# Patient Record
Sex: Male | Born: 1980 | Race: White | Hispanic: No | Marital: Married
Health system: Southern US, Community
[De-identification: ages and names within clinical notes are randomized; demographics above are authoritative.]

---

## 2020-07-24 ENCOUNTER — Emergency Department (HOSPITAL_COMMUNITY): Payer: Worker's Compensation

## 2020-07-24 ENCOUNTER — Ambulatory Visit (HOSPITAL_COMMUNITY)
Admission: EM | Admit: 2020-07-24 | Discharge: 2020-07-24 | Disposition: A | Discharge status: Home / Self Care | Payer: Worker's Compensation | Attending: Emergency Medicine | Admitting: Emergency Medicine

## 2020-07-24 ENCOUNTER — Other Ambulatory Visit: Payer: Self-pay

## 2020-07-24 ENCOUNTER — Encounter (HOSPITAL_COMMUNITY): Payer: Self-pay | Admitting: *Deleted

## 2020-07-24 ENCOUNTER — Encounter (HOSPITAL_COMMUNITY)
Admission: EM | Disposition: A | Discharge status: Home / Self Care | Payer: Self-pay | Source: Home / Self Care | Attending: Emergency Medicine

## 2020-07-24 ENCOUNTER — Emergency Department (HOSPITAL_COMMUNITY): Payer: Worker's Compensation | Admitting: Certified Registered Nurse Anesthetist

## 2020-07-24 DIAGNOSIS — S66195A Other injury of flexor muscle, fascia and tendon of left ring finger at wrist and hand level, initial encounter: Secondary | ICD-10-CM | POA: Diagnosis not present

## 2020-07-24 DIAGNOSIS — S62617B Displaced fracture of proximal phalanx of left little finger, initial encounter for open fracture: Secondary | ICD-10-CM

## 2020-07-24 DIAGNOSIS — S62615B Displaced fracture of proximal phalanx of left ring finger, initial encounter for open fracture: Secondary | ICD-10-CM | POA: Diagnosis not present

## 2020-07-24 DIAGNOSIS — Z20822 Contact with and (suspected) exposure to covid-19: Secondary | ICD-10-CM | POA: Insufficient documentation

## 2020-07-24 DIAGNOSIS — S56126A Laceration of flexor muscle, fascia and tendon of left ring finger at forearm level, initial encounter: Secondary | ICD-10-CM | POA: Insufficient documentation

## 2020-07-24 DIAGNOSIS — W230XXA Caught, crushed, jammed, or pinched between moving objects, initial encounter: Secondary | ICD-10-CM | POA: Insufficient documentation

## 2020-07-24 DIAGNOSIS — E669 Obesity, unspecified: Secondary | ICD-10-CM | POA: Insufficient documentation

## 2020-07-24 DIAGNOSIS — Z6838 Body mass index (BMI) 38.0-38.9, adult: Secondary | ICD-10-CM | POA: Diagnosis not present

## 2020-07-24 DIAGNOSIS — S61412A Laceration without foreign body of left hand, initial encounter: Secondary | ICD-10-CM

## 2020-07-24 HISTORY — PX: I & D EXTREMITY: SHX5045

## 2020-07-24 HISTORY — PX: CLOSED REDUCTION FINGER WITH PERCUTANEOUS PINNING: SHX5612

## 2020-07-24 LAB — SARS CORONAVIRUS 2 BY RT PCR (HOSPITAL ORDER, PERFORMED IN ~~LOC~~ HOSPITAL LAB): SARS Coronavirus 2: NEGATIVE

## 2020-07-24 SURGERY — CLOSED REDUCTION, FINGER, WITH PERCUTANEOUS PINNING
Anesthesia: General | Site: Finger | Laterality: Left

## 2020-07-24 MED ORDER — ACETAMINOPHEN 10 MG/ML IV SOLN
INTRAVENOUS | Status: DC | PRN
Start: 2020-07-24 — End: 2020-07-24
  Administered 2020-07-24: 1000 mg via INTRAVENOUS

## 2020-07-24 MED ORDER — PROMETHAZINE HCL 25 MG/ML IJ SOLN: 6.2500 mg | INTRAMUSCULAR | Status: DC | PRN

## 2020-07-24 MED ORDER — DEXAMETHASONE SODIUM PHOSPHATE 10 MG/ML IJ SOLN
INTRAMUSCULAR | Status: AC
  Filled 2020-07-24: qty 1

## 2020-07-24 MED ORDER — CEFAZOLIN SODIUM-DEXTROSE 1-4 GM/50ML-% IV SOLN
1.0000 g | Freq: Once | INTRAVENOUS | Status: AC
  Administered 2020-07-24: 1 g via INTRAVENOUS
  Filled 2020-07-24: qty 50

## 2020-07-24 MED ORDER — PROPOFOL 10 MG/ML IV BOLUS
INTRAVENOUS | Status: DC | PRN
  Administered 2020-07-24: 200 mg via INTRAVENOUS

## 2020-07-24 MED ORDER — POVIDONE-IODINE 10 % EX SWAB
2.0000 "application " | Freq: Once | CUTANEOUS | Status: AC
  Administered 2020-07-24: 2 via TOPICAL

## 2020-07-24 MED ORDER — OXYCODONE-ACETAMINOPHEN 5-325 MG PO TABS
ORAL_TABLET | ORAL | Status: AC
  Filled 2020-07-24: qty 2

## 2020-07-24 MED ORDER — MIDAZOLAM HCL 5 MG/5ML IJ SOLN
INTRAMUSCULAR | Status: DC | PRN
  Administered 2020-07-24: 2 mg via INTRAVENOUS

## 2020-07-24 MED ORDER — ORAL CARE MOUTH RINSE: 15.0000 mL | Freq: Once | OROMUCOSAL | Status: AC

## 2020-07-24 MED ORDER — PHENYLEPHRINE 40 MCG/ML (10ML) SYRINGE FOR IV PUSH (FOR BLOOD PRESSURE SUPPORT)
PREFILLED_SYRINGE | INTRAVENOUS | Status: DC | PRN
  Administered 2020-07-24: 80 ug via INTRAVENOUS

## 2020-07-24 MED ORDER — BUPIVACAINE HCL (PF) 0.25 % IJ SOLN
INTRAMUSCULAR | Status: DC | PRN
  Administered 2020-07-24: 8 mL

## 2020-07-24 MED ORDER — MIDAZOLAM HCL 2 MG/2ML IJ SOLN
INTRAMUSCULAR | Status: AC
  Filled 2020-07-24: qty 2

## 2020-07-24 MED ORDER — PHENYLEPHRINE 40 MCG/ML (10ML) SYRINGE FOR IV PUSH (FOR BLOOD PRESSURE SUPPORT)
PREFILLED_SYRINGE | INTRAVENOUS | Status: AC
  Filled 2020-07-24: qty 10

## 2020-07-24 MED ORDER — LACTATED RINGERS IV SOLN: INTRAVENOUS | Status: DC

## 2020-07-24 MED ORDER — FENTANYL CITRATE (PF) 250 MCG/5ML IJ SOLN
INTRAMUSCULAR | Status: AC
  Filled 2020-07-24: qty 5

## 2020-07-24 MED ORDER — CEPHALEXIN 500 MG PO CAPS: 500.0000 mg | ORAL_CAPSULE | Freq: Four times a day (QID) | ORAL | 0 refills | Status: AC

## 2020-07-24 MED ORDER — CEFAZOLIN SODIUM-DEXTROSE 2-4 GM/100ML-% IV SOLN
2.0000 g | INTRAVENOUS | Status: AC
  Administered 2020-07-24: 2 g via INTRAVENOUS

## 2020-07-24 MED ORDER — FENTANYL CITRATE (PF) 100 MCG/2ML IJ SOLN: 25.0000 ug | INTRAMUSCULAR | Status: DC | PRN

## 2020-07-24 MED ORDER — LIDOCAINE 2% (20 MG/ML) 5 ML SYRINGE
INTRAMUSCULAR | Status: DC | PRN
  Administered 2020-07-24: 80 mg via INTRAVENOUS

## 2020-07-24 MED ORDER — LIDOCAINE 2% (20 MG/ML) 5 ML SYRINGE
INTRAMUSCULAR | Status: AC
  Filled 2020-07-24: qty 5

## 2020-07-24 MED ORDER — FENTANYL CITRATE (PF) 100 MCG/2ML IJ SOLN: 50.0000 ug | Freq: Once | INTRAMUSCULAR | Status: AC

## 2020-07-24 MED ORDER — ACETAMINOPHEN 10 MG/ML IV SOLN
INTRAVENOUS | Status: AC
  Filled 2020-07-24: qty 100

## 2020-07-24 MED ORDER — PROPOFOL 10 MG/ML IV BOLUS
INTRAVENOUS | Status: AC
  Filled 2020-07-24: qty 20

## 2020-07-24 MED ORDER — ARTIFICIAL TEARS OPHTHALMIC OINT
TOPICAL_OINTMENT | OPHTHALMIC | Status: AC
  Filled 2020-07-24: qty 3.5

## 2020-07-24 MED ORDER — ONDANSETRON HCL 4 MG/2ML IJ SOLN
INTRAMUSCULAR | Status: AC
  Filled 2020-07-24: qty 2

## 2020-07-24 MED ORDER — CHLORHEXIDINE GLUCONATE 0.12 % MT SOLN
15.0000 mL | Freq: Once | OROMUCOSAL | Status: AC
  Administered 2020-07-24: 15 mL via OROMUCOSAL

## 2020-07-24 MED ORDER — OXYCODONE-ACETAMINOPHEN 5-325 MG PO TABS
2.0000 | ORAL_TABLET | Freq: Once | ORAL | Status: AC
  Administered 2020-07-24: 2 via ORAL

## 2020-07-24 MED ORDER — FENTANYL CITRATE (PF) 100 MCG/2ML IJ SOLN
100.0000 ug | Freq: Once | INTRAMUSCULAR | Status: AC
  Administered 2020-07-24: 100 ug via INTRAVENOUS
  Filled 2020-07-24: qty 2

## 2020-07-24 MED ORDER — SODIUM CHLORIDE 0.9 % IR SOLN
Status: DC | PRN
  Administered 2020-07-24: 1000 mL

## 2020-07-24 MED ORDER — FENTANYL CITRATE (PF) 250 MCG/5ML IJ SOLN
INTRAMUSCULAR | Status: DC | PRN
  Administered 2020-07-24 (×3): 50 ug via INTRAVENOUS

## 2020-07-24 MED ORDER — CHLORHEXIDINE GLUCONATE 4 % EX LIQD
60.0000 mL | Freq: Once | CUTANEOUS | Status: DC
  Filled 2020-07-24: qty 60

## 2020-07-24 MED ORDER — FENTANYL CITRATE (PF) 100 MCG/2ML IJ SOLN
INTRAMUSCULAR | Status: AC
  Administered 2020-07-24: 50 ug via INTRAVENOUS
  Filled 2020-07-24: qty 2

## 2020-07-24 MED ORDER — ONDANSETRON HCL 4 MG/2ML IJ SOLN
INTRAMUSCULAR | Status: DC | PRN
  Administered 2020-07-24: 4 mg via INTRAVENOUS

## 2020-07-24 MED ORDER — OXYCODONE-ACETAMINOPHEN 10-325 MG PO TABS: 1.0000 | ORAL_TABLET | Freq: Four times a day (QID) | ORAL | 0 refills | Status: AC | PRN

## 2020-07-24 MED ORDER — DEXAMETHASONE SODIUM PHOSPHATE 10 MG/ML IJ SOLN
INTRAMUSCULAR | Status: DC | PRN
Start: 2020-07-24 — End: 2020-07-24
  Administered 2020-07-24: 8 mg via INTRAVENOUS

## 2020-07-24 MED ORDER — BUPIVACAINE HCL (PF) 0.25 % IJ SOLN
INTRAMUSCULAR | Status: AC
  Filled 2020-07-24: qty 30

## 2020-07-24 SURGICAL SUPPLY — 31 items
BNDG ESMARK 4X9 LF (GAUZE/BANDAGES/DRESSINGS) ×4 IMPLANT
BNDG GAUZE ELAST 4 BULKY (GAUZE/BANDAGES/DRESSINGS) ×4 IMPLANT
CORD BIPOLAR FORCEPS 12FT (ELECTRODE) ×4 IMPLANT
CUFF TOURN SGL QUICK 18X4 (TOURNIQUET CUFF) ×4 IMPLANT
DRAPE OEC MINIVIEW 54X84 (DRAPES) ×4 IMPLANT
DRAPE SURG 17X23 STRL (DRAPES) ×4 IMPLANT
DRSG EMULSION OIL 3X3 NADH (GAUZE/BANDAGES/DRESSINGS) ×4 IMPLANT
DRSG XEROFORM 1X8 (GAUZE/BANDAGES/DRESSINGS) ×4 IMPLANT
GAUZE SPONGE 4X4 12PLY STRL (GAUZE/BANDAGES/DRESSINGS) ×4 IMPLANT
GAUZE XEROFORM 1X8 LF (GAUZE/BANDAGES/DRESSINGS) ×4 IMPLANT
GLOVE BIOGEL PI IND STRL 8.5 (GLOVE) ×2 IMPLANT
GLOVE BIOGEL PI INDICATOR 8.5 (GLOVE) ×2
GLOVE SURG ORTHO 8.0 STRL STRW (GLOVE) ×4 IMPLANT
GOWN STRL REUS W/ TWL LRG LVL3 (GOWN DISPOSABLE) ×6 IMPLANT
GOWN STRL REUS W/ TWL XL LVL3 (GOWN DISPOSABLE) ×2 IMPLANT
GOWN STRL REUS W/TWL LRG LVL3 (GOWN DISPOSABLE) ×6
GOWN STRL REUS W/TWL XL LVL3 (GOWN DISPOSABLE) ×2
KIT BASIN OR (CUSTOM PROCEDURE TRAY) ×4 IMPLANT
KIT TURNOVER KIT B (KITS) ×4 IMPLANT
NS IRRIG 1000ML POUR BTL (IV SOLUTION) ×4 IMPLANT
PACK ORTHO EXTREMITY (CUSTOM PROCEDURE TRAY) ×4 IMPLANT
SPLINT FIBERGLASS 3X12 (CAST SUPPLIES) ×4 IMPLANT
SUCTION FRAZIER HANDLE 10FR (MISCELLANEOUS) ×2
SUCTION TUBE FRAZIER 10FR DISP (MISCELLANEOUS) ×2 IMPLANT
SUT ETHILON 4 0 PS 2 18 (SUTURE) ×12 IMPLANT
SUT MERSILENE 4 0 P 3 (SUTURE) ×4 IMPLANT
SUT PROLENE 4 0 PS 2 18 (SUTURE) ×12 IMPLANT
TOWEL GREEN STERILE FF (TOWEL DISPOSABLE) ×4 IMPLANT
TUBE CONNECTING 12'X1/4 (SUCTIONS) ×1
TUBE CONNECTING 12X1/4 (SUCTIONS) ×3 IMPLANT
UNDERPAD 30X36 HEAVY ABSORB (UNDERPADS AND DIAPERS) ×4 IMPLANT

## 2020-07-24 NOTE — Op Note (Signed)
PREOPERATIVE DIAGNOSIS: LEFT RING FINGER OPEN PROXIMAL PHALANX FRACTURE  POSTOPERATIVE DIAGNOSIS:SAME  ATTENDING SURGEON:DR. Jalacia Mattila WHO WAS SCRUBBED AND PRESENT FOR THE ENTIRE PROCEDURE  ASSISTANT SURGEON: NONE  ANESTHESIA:GENERAL VIA LMA  OPERATIVE PROCEDURE: #1: Open debridement of skin subcutaneous tissue and bone associated with the left index finger proximal phalanx fracture #2: Open treatment of left index finger proximal phalanx fracture requiring internal fixation #3: Repair left ring finger A2 pulley with local tissue #4: Left ring finger ulnar digital nerve neurolysis and decompression exploration #5: Traumatic laceration repair 6 cm left ring finger #6: Radiographs 3 views left ring finger  IMPLANTS: Two 0.045 K wires.  RADIOGRAPHIC INTERPRETATION: AP lateral and oblique views of the finger do show the cross K wire fixation in place with a comminuted proximal phalanx fracture  SURGICAL INDICATIONS: Patient is a left-hand dominant gentleman who sustained a crushing injury to his left ring finger while at work.  Patient was seen evaluate and recommended undergo the above procedure.  Risks of surgery include but not limited to bleeding infection damage nearby nerves arteries or tendons nonunion malunion hardware failure loss of motion of the wrist and digits incomplete relief of symptoms and need for further surgical intervention.  SURGICAL TECHNIQUE: Patient was palpated find the preoperative holding area marked the permanent marker made the left ring finger to indicate correct operative site.  Patient brought back operating placed supine on anesthesia table where the general anesthetic was administered.  Preoperative antibiotics were given prior to skin incision.  Left upper extremities then prepped and draped normal sterile fashion after tourniquet been placed on the left brachium and sealed with the appropriate drape.  Timeout was called the correct site was identified the  procedure then begun.  Attention was then turned to the ring finger of the sick centimeter laceration was extended proximally and distally volarly.  Wound exploration and excisional debridement of the devitalized tissue of the skin subcutaneous tissue and bone several devitalized bone fragments were then removed.  This was an open debridement.  Thorough wound irrigation done.  Following this open reduction was then performed.  Two 0.045 K wires were then placed one from a retrograde approach and the other 1 from an antegrade approach.  These were well visualized crossing the fracture site with good purchase in the opposite engaging cortices.  The K wires were then cut and bent left out dorsally.  After open reduction attention was then turned to nerve exploration.  The ulnar digital artery and nerve were both carefully explored and decompressed.  Nerve decompression and exploration was then done and these were both in continuity.  Following this the patient did have the significant injury to the flexor sheath.  There was continuity of both the FDS and FDP however disruption of the A2 pulley.  With 4-0 Mersilene suture the local tissue was in tact therefore was able to repair the A2 pulley primarily.  This covered the flexor sheath very nicely.  After repair of the pulley the laceration and extended incision was then closed with Prolene suture.  This complex closure of the open wound and laceration.  6 cc of quarter percent Marcaine infiltrated volarly.  Adaptic dressing sterile compressive bandage applied.  The tourniquet was deflated with good perfusion the fingertip.  The patient was then placed with Xeroform around the pin sites dorsally.  The patient was then placed in a well-padded volar splint extubated taken recovery room in good condition.  POSTOPERATIVE PLAN: Patient be discharged to home.  Seen back in the office in 8 days for wound check K wire check x-rays back into a splint down to therapy.  We will  see him back at the 2-week mark to take the sutures out.  He will be out of work for the first week.  He will be on pain medications and therefore I will be out of work.  At the 2-week mark we may consider light duty work.  Therapy order will be placed the first visit for the splint and then will begin working on gentle range of motion of the index long and small.  K wires likely in 4 to 6 weeks.  Radiographs at each visit.

## 2020-07-24 NOTE — ED Notes (Signed)
Last ate/drank yesterday at 2200.

## 2020-07-24 NOTE — Anesthesia Preprocedure Evaluation (Signed)
Anesthesia Evaluation  Patient identified by MRN, date of birth, ID band Patient awake    Reviewed: Allergy & Precautions, NPO status , Patient's Chart, lab work & pertinent test results  Airway Mallampati: II  TM Distance: >3 FB Neck ROM: Full    Dental  (+) Dental Advisory Given, Missing   Pulmonary neg pulmonary ROS,    Pulmonary exam normal breath sounds clear to auscultation       Cardiovascular negative cardio ROS Normal cardiovascular exam Rhythm:Regular Rate:Normal     Neuro/Psych negative neurological ROS     GI/Hepatic negative GI ROS, Neg liver ROS,   Endo/Other  Obesity   Renal/GU negative Renal ROS     Musculoskeletal negative musculoskeletal ROS (+)   Abdominal   Peds  Hematology negative hematology ROS (+)   Anesthesia Other Findings Day of surgery medications reviewed with the patient.  Reproductive/Obstetrics                             Anesthesia Physical Anesthesia Plan  ASA: II  Anesthesia Plan: General   Post-op Pain Management:    Induction: Intravenous  PONV Risk Score and Plan: 2 and Midazolam, Dexamethasone and Ondansetron  Airway Management Planned: LMA  Additional Equipment:   Intra-op Plan:   Post-operative Plan: Extubation in OR  Informed Consent: I have reviewed the patients History and Physical, chart, labs and discussed the procedure including the risks, benefits and alternatives for the proposed anesthesia with the patient or authorized representative who has indicated his/her understanding and acceptance.     Dental advisory given  Plan Discussed with: CRNA  Anesthesia Plan Comments:         Anesthesia Quick Evaluation

## 2020-07-24 NOTE — Transfer of Care (Signed)
Immediate Anesthesia Transfer of Care Note  Patient: Derek Mclean  Procedure(s) Performed: CLOSED REDUCTION FINGER WITH PERCUTANEOUS PINNING (Left Finger) IRRIGATION AND DEBRIDEMENT EXTREMITY (Left )  Patient Location: PACU  Anesthesia Type:General  Level of Consciousness: patient cooperative and responds to stimulation  Airway & Oxygen Therapy: Patient Spontanous Breathing and Patient connected to face mask oxygen  Post-op Assessment: Report given to RN and Post -op Vital signs reviewed and stable  Post vital signs: Reviewed and stable  Last Vitals:  Vitals Value Taken Time  BP 146/77 07/24/20 1809  Temp    Pulse 94 07/24/20 1810  Resp 14 07/24/20 1810  SpO2 100 % 07/24/20 1810  Vitals shown include unvalidated device data.  Last Pain:  Vitals:   07/24/20 1007  TempSrc:   PainSc: 7          Complications: No complications documented.

## 2020-07-24 NOTE — Anesthesia Postprocedure Evaluation (Signed)
Anesthesia Post Note  Patient: Perley Arthurs  Procedure(s) Performed: CLOSED REDUCTION FINGER WITH PERCUTANEOUS PINNING (Left Finger) IRRIGATION AND DEBRIDEMENT EXTREMITY (Left )     Patient location during evaluation: PACU Anesthesia Type: General Level of consciousness: awake and alert Pain management: pain level controlled Vital Signs Assessment: post-procedure vital signs reviewed and stable Respiratory status: spontaneous breathing, nonlabored ventilation, respiratory function stable and patient connected to nasal cannula oxygen Cardiovascular status: blood pressure returned to baseline and stable Postop Assessment: no apparent nausea or vomiting Anesthetic complications: no   No complications documented.  Last Vitals:  Vitals:   07/24/20 1840 07/24/20 1855  BP: 123/77 (!) 140/93  Pulse: 84 81  Resp: 14 17  Temp:  36.6 C  SpO2: 95% 95%    Last Pain:  Vitals:   07/24/20 1855  TempSrc:   PainSc: 0-No pain                 Erma Joubert COKER

## 2020-07-24 NOTE — ED Triage Notes (Signed)
Pt had a dock loader come down on his left hand.  Significant lac and deep to see bone- pt has controlled bleeding.  Concern for open fracture and patient states it was crushed. Pt last tetanus 4 years ago.  Pt can wiggle fingers but not feeling in fingers

## 2020-07-24 NOTE — ED Provider Notes (Addendum)
[facility] EMERGENCY DEPARTMENT Provider Note   CSN: [id number] Arrival date & time: 07/24/20  1025     History Chief Complaint  Patient presents with  . Laceration  . Hand Injury    Derek Mclean is a 39 y.o. male.  Presents to ER with hand laceration.  Patient had Dr. Gavin Pound come down on the left hand, causing crush injury significant laceration.  Pain currently 7 out of 10 in severity sharp, stabbing.  Bleeding controlled with direct pressure.  Tetanus 2 years ago.  Left-hand-dominant.  Has noted tingling sensation in his finger but no skin discoloration and distal finger.  Denies any medical problems.  N.p.o. since last night.  Denies any other injury.  HPI     History reviewed. No pertinent past medical history.  There are no problems to display for this patient.   History reviewed. No pertinent surgical history.     No family history on file.  Social History   Tobacco Use  . Smoking status: Not on file  Substance Use Topics  . Alcohol use: Not on file  . Drug use: Not on file    Home Medications Prior to Admission medications   Not on File    Allergies    Patient has no allergy information on record.  Review of Systems   Review of Systems  Constitutional: Negative for chills and fever.  HENT: Negative for ear pain and sore throat.   Eyes: Negative for pain and visual disturbance.  Respiratory: Negative for cough and shortness of breath.   Cardiovascular: Negative for chest pain and palpitations.  Gastrointestinal: Negative for abdominal pain and vomiting.  Genitourinary: Negative for dysuria and hematuria.  Musculoskeletal: Positive for arthralgias. Negative for back pain.  Skin: Negative for color change and rash.  Neurological: Negative for seizures and syncope.  All other systems reviewed and are negative.   Physical Exam Updated Vital Signs BP 113/83 (BP Location: Right Arm)   Pulse 65   Temp 97.7 F (36.5 C) (Oral)    Resp 18   Ht 5\' 5"  (1.651 m)   Wt (!) 106.1 kg   SpO2 97%   BMI 38.94 kg/m   Physical Exam Vitals and nursing note reviewed.  Constitutional:      Appearance: He is well-developed.  HENT:     Head: Normocephalic and atraumatic.  Eyes:     Conjunctiva/sclera: Conjunctivae normal.  Cardiovascular:     Rate and Rhythm: Normal rate.     Pulses: Normal pulses.  Pulmonary:     Effort: Pulmonary effort is normal. No respiratory distress.  Musculoskeletal:     Cervical back: Neck supple.     Comments: L hand: laceration over palmar aspect at base of 4th finger, exposed flexor tendon, sensation to light touch of lateral aspect of finger decreased, unable to flex finger, can extend; good distal cap refill  Skin:    General: Skin is warm and dry.  Neurological:     Mental Status: He is alert.     Comments: Sensory deficit as above  Psychiatric:        Mood and Affect: Mood normal.        Behavior: Behavior normal.    Media Information   Document Information  Photos  Left hand  07/24/2020 10:05  Attached To:  Hospital Encounter on 07/24/20  Source Information  Quay Simkin, 07/26/20, MD  [facility]     ED Results / Procedures / Treatments   Labs (all  labs ordered are listed, but only abnormal results are displayed) Labs Reviewed  SARS CORONAVIRUS 2 BY RT PCR [facility] ORDER, PERFORMED IN [facility] LAB)    EKG None  Radiology DG Hand Complete Left  Result Date: 07/24/2020 CLINICAL DATA:  Crush injury. EXAM: LEFT HAND - COMPLETE 3+ VIEW COMPARISON:  None. FINDINGS: There is a comminuted fracture through the left ring finger proximal phalanx. There appears to be a extension into the MCP joint. Angulation and displacement noted. No subluxation or dislocation. IMPRESSION: Comminuted, angulated and displaced left 4th proximal phalangeal fracture. Electronically Signed   By: Charlett Nose M.D.   On: 07/24/2020 10:23    Procedures Procedures (including  critical care time)  Medications Ordered in ED Medications  ceFAZolin (ANCEF) IVPB 1 g/50 mL premix (1 g Intravenous New Bag/Given 07/24/20 1027)  fentaNYL (SUBLIMAZE) injection 100 mcg (100 mcg Intravenous Given 07/24/20 1020)    ED Course  I have reviewed the triage vital signs and the nursing notes.  Pertinent labs & imaging results that were available during my care of the patient were reviewed by me and considered in my medical decision making (see chart for details).  Clinical Course as of Jul 24 1100  Wed Jul 24, 2020  1020 D/w Lin Givens - he will come to bedside to evaluate   [RD]  1055 Ortho at bedside - will post for OR later today   [RD]    Clinical Course User Index [RD] Milagros Loll, MD   MDM Rules/Calculators/A&P                          39 year old male left-hand-dominant presents to ER with left hand injury.  Note significant laceration over the palmar aspect of left hand at base of fourth finger, obvious tendon involvement, laceration to flexor tendon, also noted decreased sensation along the lateral aspect of fourth finger raising concern for nerve injury.  Finger did appear to be well perfused.  Consulted hand surgery, Dale Hinton, PA came to bedside, recommending surgery later today.  We will get Covid swab, keep patient n.p.o.  Given depth of laceration, associated fracture, concern for possible open fracture, provided Ancef.  Patient reports tetanus up-to-date.   Final Clinical Impression(s) / ED Diagnoses Final diagnoses:  Laceration of left hand without foreign body, initial encounter  Open displaced fracture of proximal phalanx of left little finger, initial encounter    Rx / DC Orders ED Discharge Orders    None       Milagros Loll, MD 07/24/20 1101    Milagros Loll, MD 07/24/20 1102

## 2020-07-24 NOTE — Progress Notes (Signed)
Per Dr. Clemens Catholic, no labs needed pre-op

## 2020-07-24 NOTE — Discharge Instructions (Signed)
KEEP BANDAGE CLEAN AND DRY CALL OFFICE FOR F/U APPT [phone] RX SENT TO [facility] ON [address] KEEP HAND ELEVATED ABOVE HEART OK TO APPLY ICE TO OPERATIVE AREA CONTACT OFFICE IF ANY WORSENING PAIN OR CONCERNS.

## 2020-07-24 NOTE — Consult Note (Addendum)
Reason for Consult:Left ring finger injury Referring Physician: R Kengo Mclean is an 39 y.o. male.  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 He was wearing a glove but when he took it off he noted a severe injury and came to the ED for evaluation and hand surgery was consulted. He is LHD.  History reviewed. No pertinent past medical history.  History reviewed. No pertinent surgical history.  No family history on file.  Social History:  has no history on file for tobacco use, alcohol use, and drug use.  Allergies: Not on File  Medications: I have reviewed the patient's current medications.  No results found for this or any previous visit (from the past 48 hour(s)).  DG Hand Complete Left  Result Date: 07/24/2020 CLINICAL DATA:  Crush injury. EXAM: LEFT HAND - COMPLETE 3+ VIEW COMPARISON:  None. FINDINGS: There is a comminuted fracture through the left ring finger proximal phalanx. There appears to be a extension into the MCP joint. Angulation and displacement noted. No subluxation or dislocation. IMPRESSION: Comminuted, angulated and displaced left 4th proximal phalangeal fracture. Electronically Signed   By: Charlett Nose M.D.   On: 07/24/2020 10:23    Review of Systems  HENT: Negative for ear discharge, ear pain, hearing loss and tinnitus.   Eyes: Negative for photophobia and pain.  Respiratory: Negative for cough and shortness of breath.   Cardiovascular: Negative for chest pain.  Gastrointestinal: Negative for abdominal pain, nausea and vomiting.  Genitourinary: Negative for dysuria, flank pain, frequency and urgency.  Musculoskeletal: Positive for arthralgias (Left ring finger). Negative for back pain, myalgias and neck pain.  Neurological: Negative for dizziness and headaches.  Hematological: Does not bruise/bleed easily.  Psychiatric/Behavioral: The patient is not nervous/anxious.    Blood pressure 113/83, pulse 65,  temperature 97.7 F (36.5 C), temperature source Oral, resp. rate 18, height 5\' 5"  (1.651 m), weight (!) 106.1 kg, SpO2 97 %. Physical Exam Constitutional:      General: He is not in acute distress.    Appearance: He is well-developed. He is not diaphoretic.  HENT:     Head: Normocephalic and atraumatic.  Eyes:     General: No scleral icterus.       Right eye: No discharge.        Left eye: No discharge.     Conjunctiva/sclera: Conjunctivae normal.  Cardiovascular:     Rate and Rhythm: Normal rate and regular rhythm.  Pulmonary:     Effort: Pulmonary effort is normal. No respiratory distress.  Musculoskeletal:     Cervical back: Normal range of motion.     Comments: Left shoulder, elbow, wrist, digits- Longitudinal laceration ring finger P1 extending onto palm, tendon exposed but seemingly intact, mod TTP, flexion intact, no instability, no blocks to motion  Sens  Ax/Derek/M/U intact  Mot   Ax/ Derek/ PIN/ M/ AIN/ U intact  Rad 2+   Skin:    General: Skin is warm and dry.  Neurological:     Mental Status: He is alert.  Psychiatric:        Behavior: Behavior normal.     Assessment/Plan: Left ring finger open fx -- Plan I&D, CRPP by Dr. this afternoon. Continue NPO. Anticipate discharge after surgery.  Patient was seen and evaluated today.  The patient does have the open proximal phalanx fracture.  Patient also has the soft tissue disarray volarly.  The plan will be for  thorough wound irrigation exploration of the volar wound possible repair of nerve tendon and artery.  Likely then proceed with either percutaneous skeletal fixation of the phalanx fracture and or open reduction internal fixation.  We talked about the reason the rationale for the intervention. The note was reviewed above. This happened at work. Patient voiced understand the reason the rationale for surgery on the urgent emergent basis given the open injury and soft tissue injury.  Derek/B/A DISCUSSED WITH PT IN  HOSPITAL.  PT VOICED UNDERSTANDING OF PLAN CONSENT SIGNED DAY OF SURGERY PT SEEN AND EXAMINED PRIOR TO OPERATIVE PROCEDURE/DAY OF SURGERY SITE MARKED. QUESTIONS ANSWERED WILL GO HOME FOLLOWING SURGERY  WE ARE PLANNING SURGERY FOR YOUR UPPER EXTREMITY. THE RISKS AND BENEFITS OF SURGERY INCLUDE BUT NOT LIMITED TO BLEEDING INFECTION, DAMAGE TO NEARBY NERVES ARTERIES TENDONS, FAILURE OF SURGERY TO ACCOMPLISH ITS INTENDED GOALS, PERSISTENT SYMPTOMS AND NEED FOR FURTHER SURGICAL INTERVENTION. WITH THIS IN MIND WE WILL PROCEED. I HAVE DISCUSSED WITH THE PATIENT THE PRE AND POSTOPERATIVE REGIMEN AND THE DOS AND DON'TS. PT VOICED UNDERSTANDING AND INFORMED CONSENT SIGNED.  Derek Mclean [facility] MD 07/24/20    Freeman Caldron, PA-C Orthopedic Surgery [phone] 07/24/2020, 10:56 AM

## 2020-07-25 ENCOUNTER — Encounter (HOSPITAL_COMMUNITY): Payer: Self-pay | Admitting: Orthopedic Surgery

## 2020-12-20 IMAGING — DX DG HAND COMPLETE 3+V*L*
3 series · 3 of 3 positions shown · non-contrast
Comparison: None.

CLINICAL DATA: Crush injury.

EXAM:
LEFT HAND - COMPLETE 3+ VIEW

[x hand pa left]
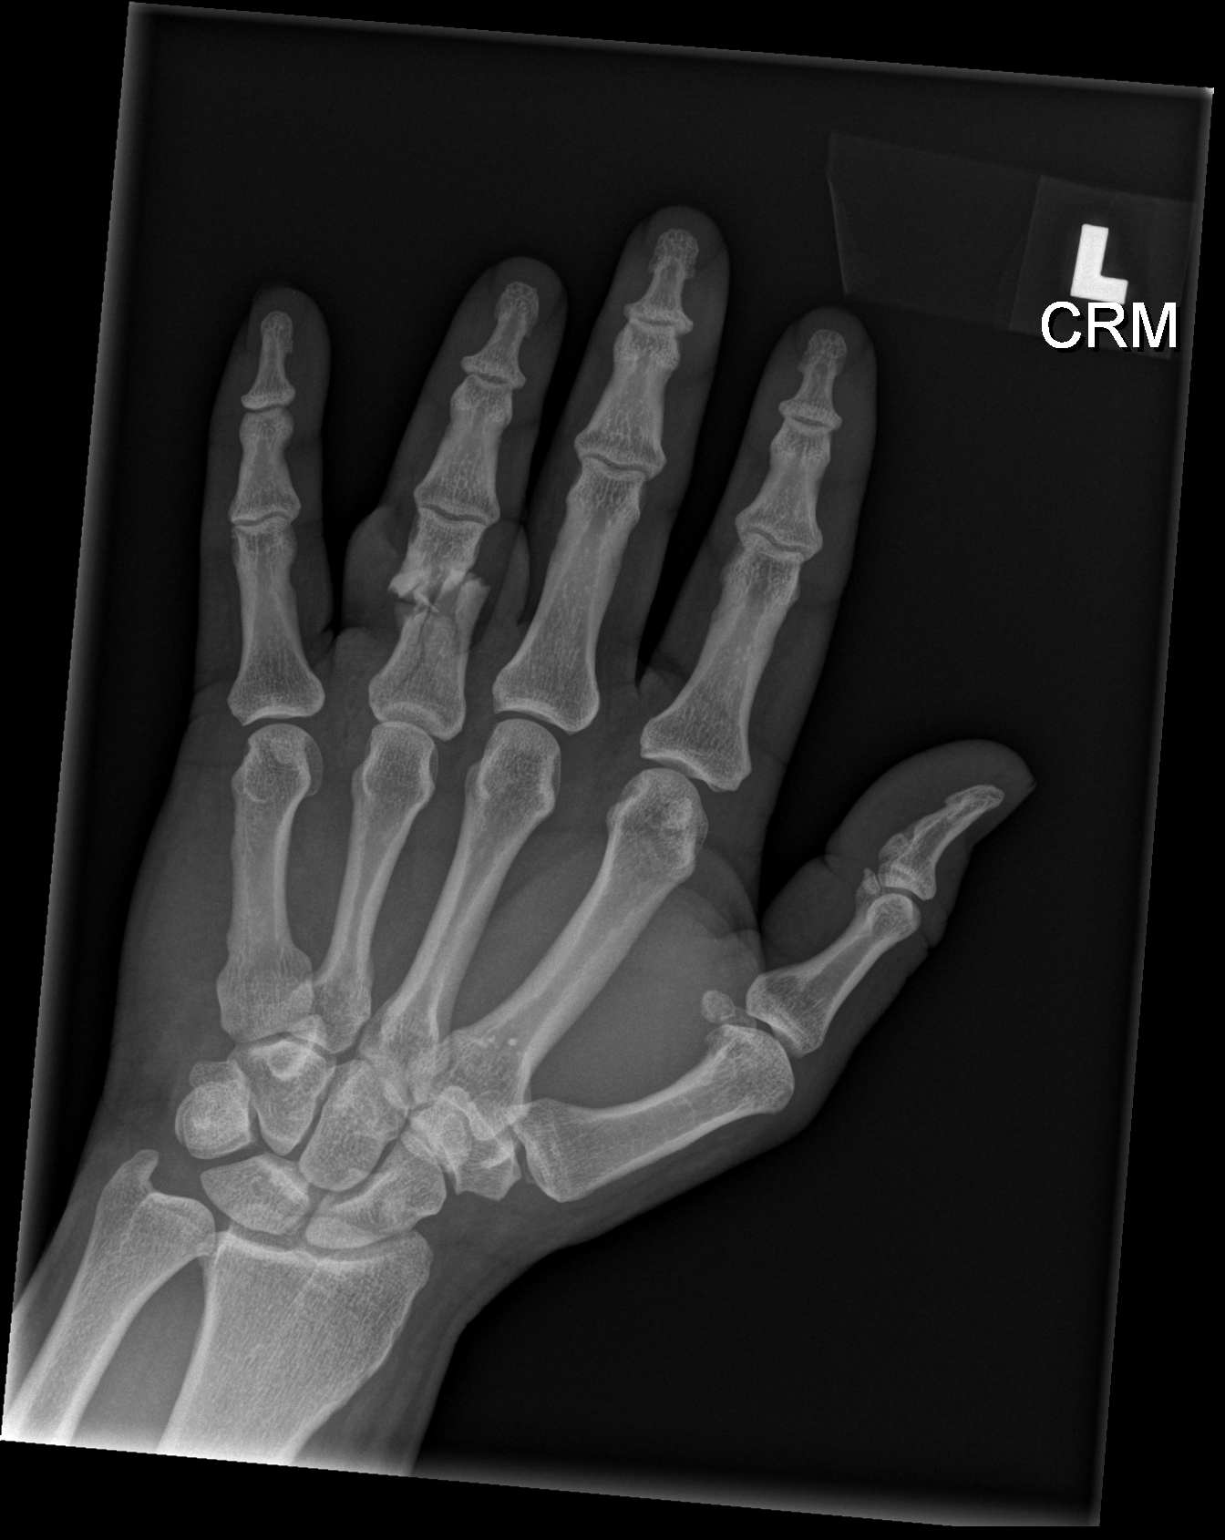

[x hand obl left]
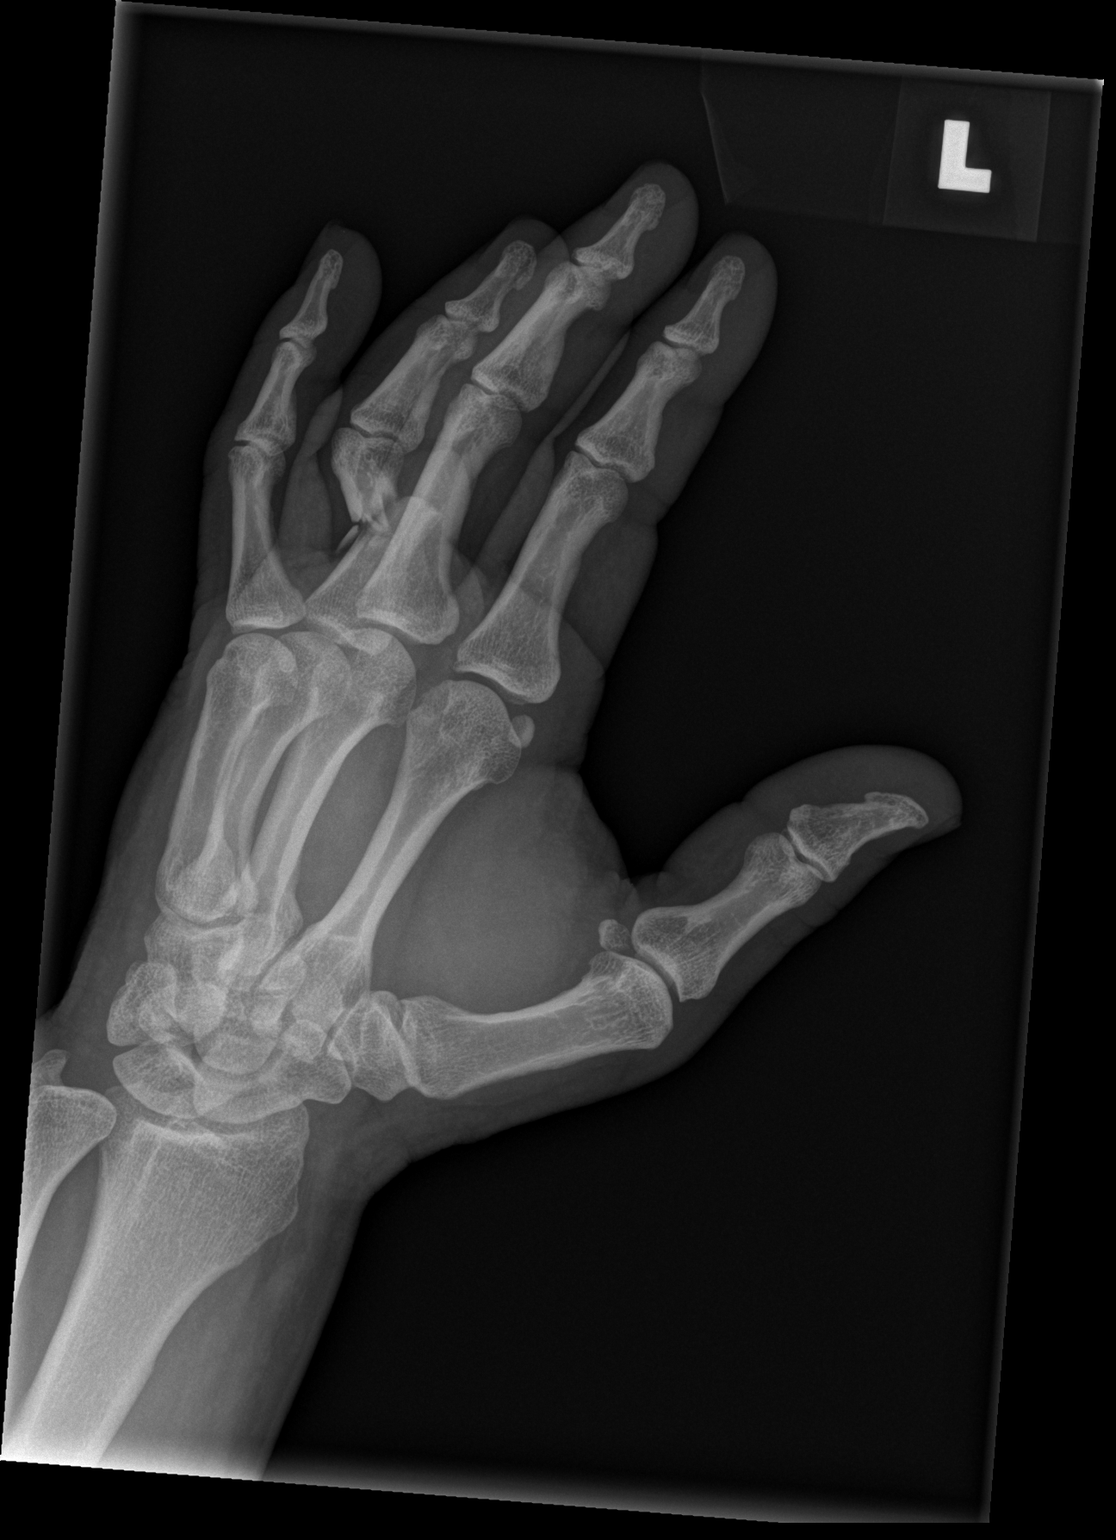

[x hand lat left]
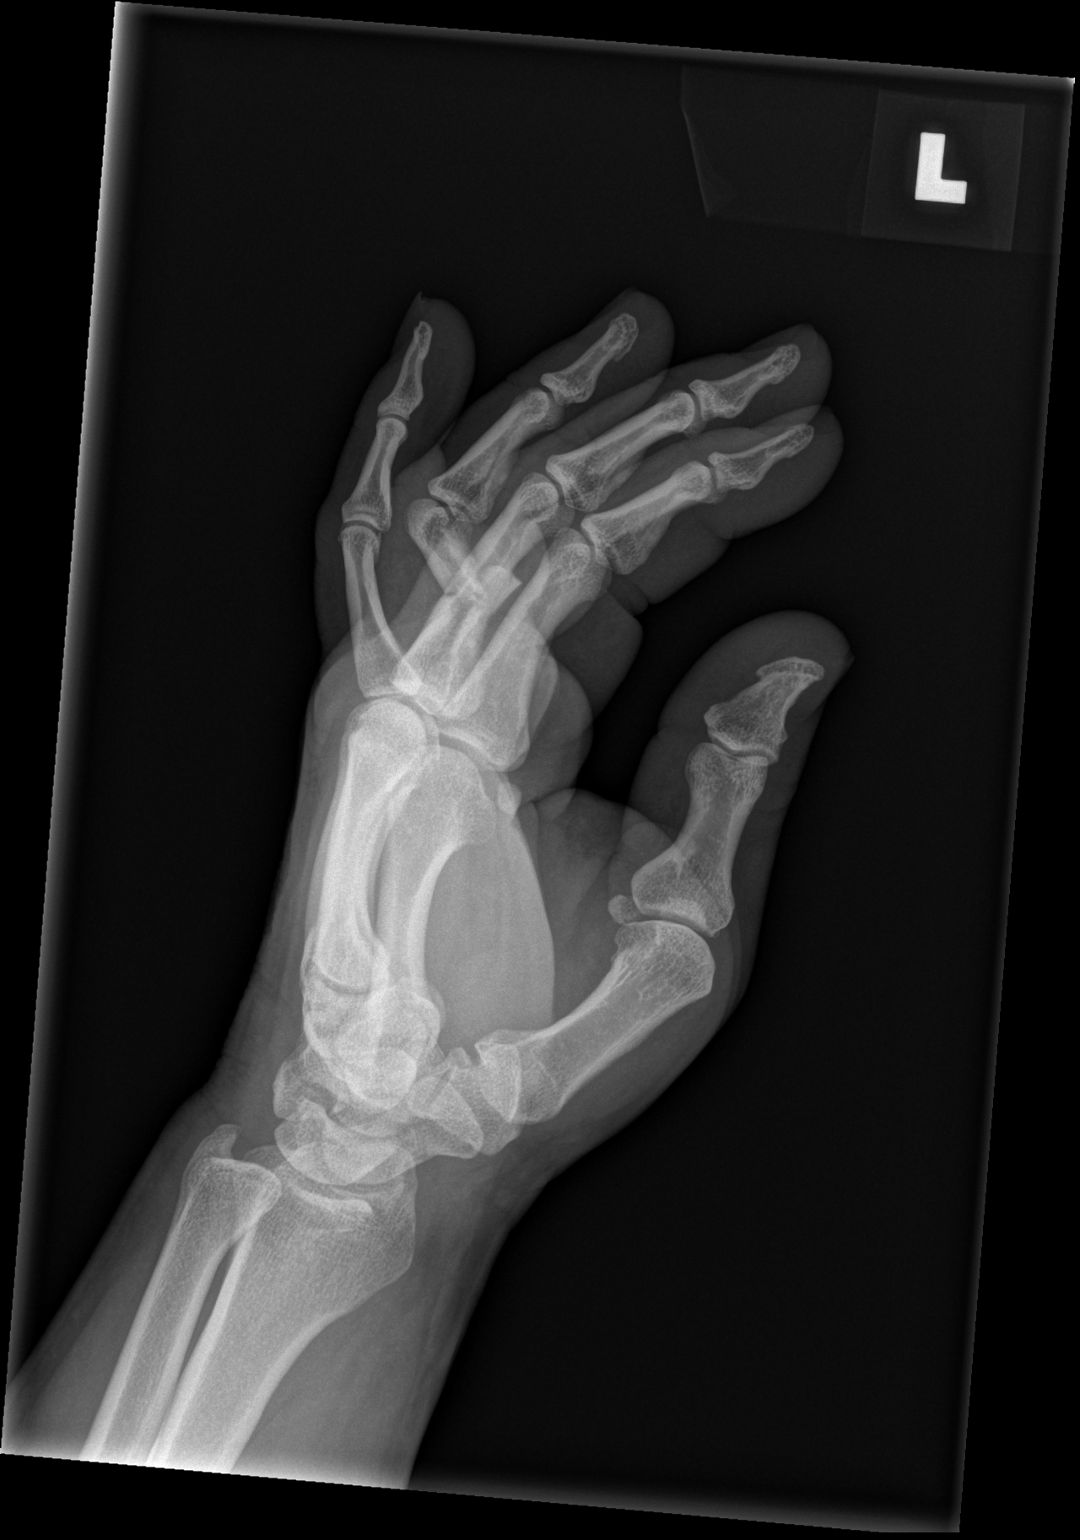

[3 of 3 positions shown; findings below may reference images not displayed]

FINDINGS: There is a comminuted fracture through the left ring finger proximal
phalanx. There appears to be a extension into the MCP joint.
Angulation and displacement noted. No subluxation or dislocation.
IMPRESSION: Comminuted, angulated and displaced left 4th proximal phalangeal
fracture.

## 2024-03-01 DIAGNOSIS — R519 Headache, unspecified: Secondary | ICD-10-CM | POA: Diagnosis not present

## 2024-03-01 DIAGNOSIS — R030 Elevated blood-pressure reading, without diagnosis of hypertension: Secondary | ICD-10-CM | POA: Diagnosis not present

## 2024-03-16 DIAGNOSIS — I10 Essential (primary) hypertension: Secondary | ICD-10-CM | POA: Diagnosis not present

## 2024-03-16 DIAGNOSIS — Z131 Encounter for screening for diabetes mellitus: Secondary | ICD-10-CM | POA: Diagnosis not present

## 2024-03-16 DIAGNOSIS — E669 Obesity, unspecified: Secondary | ICD-10-CM | POA: Diagnosis not present

## 2024-04-18 DIAGNOSIS — I10 Essential (primary) hypertension: Secondary | ICD-10-CM | POA: Diagnosis not present

## 2024-04-18 DIAGNOSIS — E781 Pure hyperglyceridemia: Secondary | ICD-10-CM | POA: Diagnosis not present

## 2024-04-18 DIAGNOSIS — E1169 Type 2 diabetes mellitus with other specified complication: Secondary | ICD-10-CM | POA: Diagnosis not present

## 2024-04-18 DIAGNOSIS — E669 Obesity, unspecified: Secondary | ICD-10-CM | POA: Diagnosis not present

## 2024-05-31 DIAGNOSIS — Z01818 Encounter for other preprocedural examination: Secondary | ICD-10-CM | POA: Diagnosis not present

## 2024-05-31 DIAGNOSIS — M5441 Lumbago with sciatica, right side: Secondary | ICD-10-CM | POA: Diagnosis not present

## 2024-06-05 DIAGNOSIS — M5441 Lumbago with sciatica, right side: Secondary | ICD-10-CM | POA: Diagnosis not present

## 2024-06-07 DIAGNOSIS — M47816 Spondylosis without myelopathy or radiculopathy, lumbar region: Secondary | ICD-10-CM | POA: Diagnosis not present

## 2024-06-07 DIAGNOSIS — M5416 Radiculopathy, lumbar region: Secondary | ICD-10-CM | POA: Diagnosis not present

## 2024-07-24 DIAGNOSIS — E781 Pure hyperglyceridemia: Secondary | ICD-10-CM | POA: Diagnosis not present

## 2024-07-24 DIAGNOSIS — E669 Obesity, unspecified: Secondary | ICD-10-CM | POA: Diagnosis not present

## 2024-07-24 DIAGNOSIS — I10 Essential (primary) hypertension: Secondary | ICD-10-CM | POA: Diagnosis not present

## 2024-07-24 DIAGNOSIS — E1169 Type 2 diabetes mellitus with other specified complication: Secondary | ICD-10-CM | POA: Diagnosis not present

## 2024-10-31 DIAGNOSIS — E1169 Type 2 diabetes mellitus with other specified complication: Secondary | ICD-10-CM | POA: Diagnosis not present

## 2024-10-31 DIAGNOSIS — E669 Obesity, unspecified: Secondary | ICD-10-CM | POA: Diagnosis not present

## 2024-10-31 DIAGNOSIS — I10 Essential (primary) hypertension: Secondary | ICD-10-CM | POA: Diagnosis not present

## 2024-10-31 DIAGNOSIS — E781 Pure hyperglyceridemia: Secondary | ICD-10-CM | POA: Diagnosis not present
